# Patient Record
Sex: Male | Born: 1955 | Race: White | Hispanic: No | Smoking: Former smoker
Health system: Southern US, Community
[De-identification: ages and names within clinical notes are randomized; demographics above are authoritative.]

## PROBLEM LIST (undated history)

## (undated) DIAGNOSIS — T7840XA Allergy, unspecified, initial encounter: Secondary | ICD-10-CM

## (undated) DIAGNOSIS — K219 Gastro-esophageal reflux disease without esophagitis: Secondary | ICD-10-CM

## (undated) DIAGNOSIS — F419 Anxiety disorder, unspecified: Secondary | ICD-10-CM

## (undated) DIAGNOSIS — I10 Essential (primary) hypertension: Secondary | ICD-10-CM

## (undated) DIAGNOSIS — R09A2 Foreign body sensation, throat: Secondary | ICD-10-CM

## (undated) DIAGNOSIS — E785 Hyperlipidemia, unspecified: Secondary | ICD-10-CM

## (undated) DIAGNOSIS — R0989 Other specified symptoms and signs involving the circulatory and respiratory systems: Secondary | ICD-10-CM

## (undated) DIAGNOSIS — R198 Other specified symptoms and signs involving the digestive system and abdomen: Secondary | ICD-10-CM

## (undated) HISTORY — DX: Gastro-esophageal reflux disease without esophagitis: K21.9

## (undated) HISTORY — DX: Anxiety disorder, unspecified: F41.9

## (undated) HISTORY — PX: UPPER GASTROINTESTINAL ENDOSCOPY: SHX188

## (undated) HISTORY — PX: COLONOSCOPY: SHX174

## (undated) HISTORY — DX: Foreign body sensation, throat: R09.A2

## (undated) HISTORY — DX: Essential (primary) hypertension: I10

## (undated) HISTORY — DX: Hyperlipidemia, unspecified: E78.5

## (undated) HISTORY — DX: Other specified symptoms and signs involving the digestive system and abdomen: R19.8

## (undated) HISTORY — DX: Allergy, unspecified, initial encounter: T78.40XA

## (undated) HISTORY — DX: Other specified symptoms and signs involving the circulatory and respiratory systems: R09.89

---

## 1990-01-15 HISTORY — PX: INGUINAL HERNIA REPAIR: SUR1180

## 2000-08-30 ENCOUNTER — Emergency Department (HOSPITAL_COMMUNITY): Admission: EM | Admit: 2000-08-30 | Discharge: 2000-08-30 | Payer: Self-pay | Admitting: Emergency Medicine

## 2000-08-30 ENCOUNTER — Encounter: Payer: Self-pay | Admitting: Emergency Medicine

## 2009-08-09 ENCOUNTER — Ambulatory Visit: Payer: Self-pay | Admitting: Cardiovascular Disease

## 2009-08-09 ENCOUNTER — Observation Stay (HOSPITAL_COMMUNITY): Admission: EM | Admit: 2009-08-09 | Discharge: 2009-08-10 | Payer: Self-pay | Admitting: Emergency Medicine

## 2010-04-01 LAB — DIFFERENTIAL
Basophils Absolute: 0 10*3/uL (ref 0.0–0.1)
Basophils Relative: 1 % (ref 0–1)
Eosinophils Absolute: 0.1 10*3/uL (ref 0.0–0.7)
Eosinophils Relative: 1 % (ref 0–5)
Lymphocytes Relative: 23 % (ref 12–46)
Lymphs Abs: 1.5 10*3/uL (ref 0.7–4.0)
Monocytes Absolute: 0.7 10*3/uL (ref 0.1–1.0)
Monocytes Relative: 10 % (ref 3–12)
Neutro Abs: 4.3 10*3/uL (ref 1.7–7.7)
Neutrophils Relative %: 65 % (ref 43–77)

## 2010-04-01 LAB — CBC
HCT: 46.7 % (ref 39.0–52.0)
Hemoglobin: 16 g/dL (ref 13.0–17.0)
MCH: 32.9 pg (ref 26.0–34.0)
MCHC: 34.3 g/dL (ref 30.0–36.0)
MCV: 96 fL (ref 78.0–100.0)
Platelets: 239 10*3/uL (ref 150–400)
RBC: 4.86 MIL/uL (ref 4.22–5.81)
RDW: 12.8 % (ref 11.5–15.5)
WBC: 6.6 10*3/uL (ref 4.0–10.5)

## 2010-04-01 LAB — BASIC METABOLIC PANEL
BUN: 11 mg/dL (ref 6–23)
CO2: 28 mEq/L (ref 19–32)
Calcium: 9 mg/dL (ref 8.4–10.5)
Chloride: 104 mEq/L (ref 96–112)
Creatinine, Ser: 0.99 mg/dL (ref 0.4–1.5)
GFR calc Af Amer: >60 mL/min (ref >60)
GFR calc non Af Amer: >60 mL/min (ref >60)
GFR calc non Af Amer: >60 mL/min (ref >60)
Glucose, Bld: 103 mg/dL — ABNORMAL HIGH (ref 70–99)
Potassium: 3.5 mEq/L (ref 3.5–5.1)
Potassium: 3.5 mEq/L (ref 3.5–5.1)
Sodium: 139 mEq/L (ref 135–145)
Sodium: 139 mEq/L (ref 135–145)

## 2010-04-01 LAB — POCT CARDIAC MARKERS
Myoglobin, poc: 53.8 ng/mL (ref 12–200)
Troponin i, poc: <0.05 ng/mL (ref 0.00–0.09)

## 2010-04-01 LAB — CARDIAC PANEL(CRET KIN+CKTOT+MB+TROPI)
CK, MB: 0.7 ng/mL (ref 0.3–4.0)
Relative Index: INVALID (ref 0.0–2.5)
Relative Index: INVALID (ref 0.0–2.5)
Relative Index: INVALID (ref 0.0–2.5)
Total CK: 65 U/L (ref 7–232)
Total CK: 76 U/L (ref 7–232)
Troponin I: <0.01 ng/mL (ref 0.00–0.06)
Troponin I: <0.01 ng/mL (ref 0.00–0.06)

## 2010-04-01 LAB — HEPATIC FUNCTION PANEL
ALT: 23 U/L (ref 0–53)
AST: 29 U/L (ref 0–37)
Albumin: 4.6 g/dL (ref 3.5–5.2)
Alkaline Phosphatase: 44 U/L (ref 39–117)
Bilirubin, Direct: 0.2 mg/dL (ref 0.0–0.3)
Indirect Bilirubin: 0.4 mg/dL (ref 0.3–0.9)
Total Bilirubin: 0.6 mg/dL (ref 0.3–1.2)
Total Protein: 7.8 g/dL (ref 6.0–8.3)

## 2010-04-01 LAB — LIPID PANEL
Total CHOL/HDL Ratio: 2.8 RATIO
VLDL: 16 mg/dL (ref 0–40)

## 2010-04-01 LAB — PROTIME-INR: Prothrombin Time: 13.6 seconds (ref 11.6–15.2)

## 2010-04-01 LAB — LIPASE, BLOOD: Lipase: 35 U/L (ref 11–59)

## 2011-08-23 IMAGING — CR DG CHEST 1V PORT
1 series · 1 of 1 positions shown · non-contrast
Comparison: None.

CLINICAL DATA: Chest and left arm pain

PORTABLE CHEST - 1 VIEW

[view not recorded]
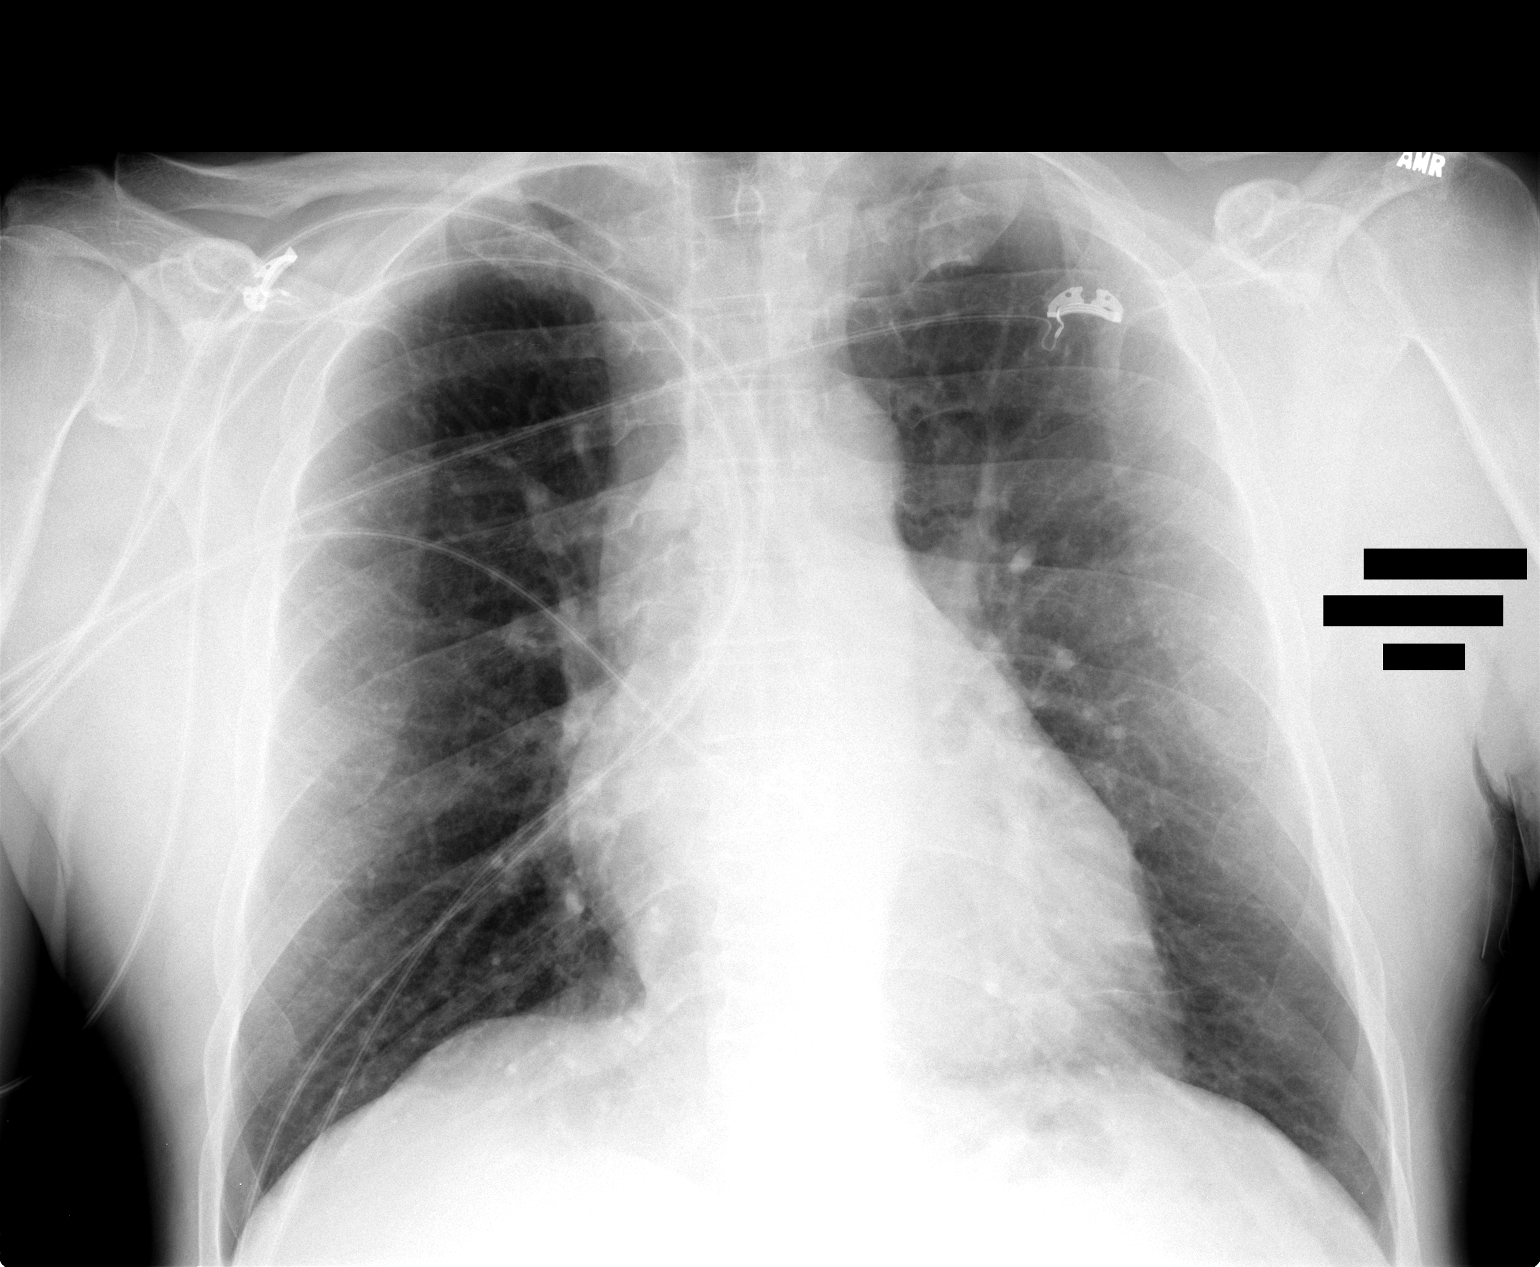

[1 of 1 positions shown; findings below may reference images not displayed]

FINDINGS: The lungs are mildly hyperexpanded and there is
interstitial prominence seen throughout both lungs.  Linear
opacities are seen at the left lung base favoring atelectasis.
The cardiac silhouette is at the upper limits of normal in size.
The upper abdomen and osseous structures unremarkable.  .
IMPRESSION: Mild pulmonary vascular congestion superimposed on chronic changes.

## 2015-10-27 ENCOUNTER — Other Ambulatory Visit: Payer: Self-pay

## 2015-10-27 DIAGNOSIS — Z1211 Encounter for screening for malignant neoplasm of colon: Secondary | ICD-10-CM

## 2015-10-27 DIAGNOSIS — K30 Functional dyspepsia: Secondary | ICD-10-CM

## 2015-10-27 DIAGNOSIS — K219 Gastro-esophageal reflux disease without esophagitis: Secondary | ICD-10-CM

## 2015-10-31 ENCOUNTER — Encounter: Payer: Self-pay | Admitting: Gastroenterology

## 2015-11-08 ENCOUNTER — Encounter: Payer: Self-pay | Admitting: Gastroenterology

## 2015-11-08 ENCOUNTER — Encounter: Payer: Self-pay | Admitting: *Deleted

## 2015-11-14 ENCOUNTER — Encounter: Payer: Self-pay | Admitting: Gastroenterology

## 2015-11-14 ENCOUNTER — Ambulatory Visit (AMBULATORY_SURGERY_CENTER): Payer: BLUE CROSS/BLUE SHIELD | Admitting: Gastroenterology

## 2015-11-14 VITALS — BP 137/92 | HR 68 | Temp 99.1°F | Resp 12 | Ht 72.0 in | Wt 205.0 lb

## 2015-11-14 DIAGNOSIS — Z1211 Encounter for screening for malignant neoplasm of colon: Secondary | ICD-10-CM | POA: Diagnosis not present

## 2015-11-14 DIAGNOSIS — Z1212 Encounter for screening for malignant neoplasm of rectum: Secondary | ICD-10-CM

## 2015-11-14 DIAGNOSIS — K219 Gastro-esophageal reflux disease without esophagitis: Secondary | ICD-10-CM

## 2015-11-14 DIAGNOSIS — D125 Benign neoplasm of sigmoid colon: Secondary | ICD-10-CM | POA: Diagnosis not present

## 2015-11-14 DIAGNOSIS — D120 Benign neoplasm of cecum: Secondary | ICD-10-CM

## 2015-11-14 DIAGNOSIS — R1013 Epigastric pain: Secondary | ICD-10-CM

## 2015-11-14 MED ORDER — PANTOPRAZOLE SODIUM 40 MG PO TBEC: 40.0000 mg | DELAYED_RELEASE_TABLET | Freq: Two times a day (BID) | ORAL | 11 refills | Status: DC

## 2015-11-14 MED ORDER — SODIUM CHLORIDE 0.9 % IV SOLN: 500.0000 mL | INTRAVENOUS | Status: DC

## 2015-11-14 NOTE — Op Note (Signed)
[place] Patient Name: Rodney Park Procedure Date: 11/14/2015 3:19 PM MRN: [id number] Endoscopist: Mauri Pole , MD Age: 60 Referring MD:  Date of Birth: 10-Apr-1955 Gender: Male Account #: [id number] Procedure:                Upper GI endoscopy Indications:              New-onset upper abdominal symptoms in patient older                            than 50 years, Heartburn, Suspected                            gastro-esophageal reflux disease Medicines:                Monitored Anesthesia Care Procedure:                Pre-Anesthesia Assessment:                           - Prior to the procedure, a History and Physical                            was performed, and patient medications and                            allergies were reviewed. The patient's tolerance of                            previous anesthesia was also reviewed. The risks                            and benefits of the procedure and the sedation                            options and risks were discussed with the patient.                            All questions were answered, and informed consent                            was obtained. Prior Anticoagulants: The patient has                            taken no previous anticoagulant or antiplatelet                            agents. ASA Grade Assessment: II - A patient with                            mild systemic disease. After reviewing the risks                            and benefits, the patient was deemed in  satisfactory condition to undergo the procedure.                           After obtaining informed consent, the endoscope was                            passed under direct vision. Throughout the                            procedure, the patient's blood pressure, pulse, and                            oxygen saturations were monitored continuously. The                            Model GIF-HQ190 [id number])  scope was introduced                            through the mouth, and advanced to the second part                            of duodenum. The upper GI endoscopy was                            accomplished without difficulty. The patient                            tolerated the procedure well. Scope In: Scope Out: Findings:                 The esophagus and gastroesophageal junction were                            examined with white light and narrow band imaging                            (NBI). There were esophageal mucosal changes                            suggestive of long-segment Barrett's esophagus,                            classified as Barrett's stage C2-M4 per [place]                            criteria. These changes involved the mucosa at the                            upper extent of the gastric folds (43 cm from the                            incisors) extending to the Z-line (39 cm from the  incisors). Circumferential salmon-colored mucosa                            was present at 41 cm and two tongues of                            salmon-colored mucosa were present at 39 cm. The                            maximum longitudinal extent of these esophageal                            mucosal changes was 4 cm in length. Mucosa was                            biopsied with a cold forceps for histology in 4                            quadrants at intervals of 2 cm at 43 cm from the                            incisors. A total of 2 specimen bottles were sent                            to pathology.                           A few localized, less than 5 mm non-bleeding                            erosions were found in the gastric antrum. There                            were stigmata of recent bleeding. Biopsies were                            taken with a cold forceps for Helicobacter pylori                            testing.                            The examined duodenum was normal. Complications:            No immediate complications. Estimated Blood Loss:     Estimated blood loss was minimal. Impression:               - Esophageal mucosal changes suggestive of                            long-segment Barrett's esophagus, classified as                            Barrett's stage C2-M4 per Prague criteria. Biopsied.                           -  Non-bleeding erosive gastropathy. Biopsied.                           - Normal examined duodenum. Recommendation:           - Patient has a contact number available for                            emergencies. The signs and symptoms of potential                            delayed complications were discussed with the                            patient. Return to normal activities tomorrow.                            Written discharge instructions were provided to the                            patient.                           - Resume previous diet.                           - Continue present medications.                           - Await pathology results.                           - Use Protonix (pantoprazole) 40 mg PO BID.                           - Follow an antireflux regimen. Mauri Pole, MD 11/14/2015 4:01:58 PM This report has been signed electronically.

## 2015-11-14 NOTE — Progress Notes (Signed)
No egg or soy allergy known to patient  No issues with past sedation with any surgeries  or procedures, no intubation problems  No diet pills per patient No home 02 use per patient  No blood thinners per patient  Pt denies issues with constipation  No A fib or A flutter   

## 2015-11-14 NOTE — Patient Instructions (Signed)
Discharge instructions given. Handouts on polyps,diverticulosis. Prescription sent into pharmacy. No ibuprofen,naproxen,or other non-steroidal anti-inflammatory drugs for 2 weeks. Biopsies taken. Resume previous medications. YOU HAD AN ENDOSCOPIC PROCEDURE TODAY AT [facility]:   Refer to the procedure report that was given to you for any specific questions about what was found during the examination.  If the procedure report does not answer your questions, please call your gastroenterologist to clarify.  If you requested that your care partner not be given the details of your procedure findings, then the procedure report has been included in a sealed envelope for you to review at your convenience later.  YOU SHOULD EXPECT: Some feelings of bloating in the abdomen. Passage of more gas than usual.  Walking can help get rid of the air that was put into your GI tract during the procedure and reduce the bloating. If you had a lower endoscopy (such as a colonoscopy or flexible sigmoidoscopy) you may notice spotting of blood in your stool or on the toilet paper. If you underwent a bowel prep for your procedure, you may not have a normal bowel movement for a few days.  Please Note:  You might notice some irritation and congestion in your nose or some drainage.  This is from the oxygen used during your procedure.  There is no need for concern and it should clear up in a day or so.  SYMPTOMS TO REPORT IMMEDIATELY:   Following lower endoscopy (colonoscopy or flexible sigmoidoscopy):  Excessive amounts of blood in the stool  Significant tenderness or worsening of abdominal pains  Swelling of the abdomen that is new, acute  Fever of 100F or higher   Following upper endoscopy (EGD)  Vomiting of blood or coffee ground material  New chest pain or pain under the shoulder blades  Painful or persistently difficult swallowing  New shortness of breath  Fever of 100F or higher  Black,  tarry-looking stools  For urgent or emergent issues, a gastroenterologist can be reached at any hour by calling [phone].   DIET:  We do recommend a small meal at first, but then you may proceed to your regular diet.  Drink plenty of fluids but you should avoid alcoholic beverages for 24 hours.  ACTIVITY:  You should plan to take it easy for the rest of today and you should NOT DRIVE or use heavy machinery until tomorrow (because of the sedation medicines used during the test).    FOLLOW UP: Our staff will call the number listed on your records the next business day following your procedure to check on you and address any questions or concerns that you may have regarding the information given to you following your procedure. If we do not reach you, we will leave a message.  However, if you are feeling well and you are not experiencing any problems, there is no need to return our call.  We will assume that you have returned to your regular daily activities without incident.  If any biopsies were taken you will be contacted by phone or by letter within the next 1-3 weeks.  Please call us at [phone] if you have not heard about the biopsies in 3 weeks.    SIGNATURES/CONFIDENTIALITY: You and/or your care partner have signed paperwork which will be entered into your electronic medical record.  These signatures attest to the fact that that the information above on your After Visit Summary has been reviewed and is understood.  Full responsibility of  the confidentiality of this discharge information lies with you and/or your care-partner. 

## 2015-11-14 NOTE — Progress Notes (Signed)
Called to room to assist during endoscopic procedure.  Patient ID and intended procedure confirmed with present staff. Received instructions for my participation in the procedure from the performing physician.  

## 2015-11-14 NOTE — Progress Notes (Signed)
To Pacu  PT awake and alert. Report to RN

## 2015-11-14 NOTE — Op Note (Signed)
[place] Patient Name: Rodney Park Procedure Date: 11/14/2015 3:18 PM MRN: [id number] Endoscopist: Mauri Pole , MD Age: 60 Referring MD:  Date of Birth: 13-Feb-1955 Gender: Male Account #: [id number] Procedure:                Colonoscopy Indications:              Screening for colorectal malignant neoplasm, This                            is the patient's first colonoscopy Medicines:                Monitored Anesthesia Care Procedure:                Pre-Anesthesia Assessment:                           - Prior to the procedure, a History and Physical                            was performed, and patient medications and                            allergies were reviewed. The patient's tolerance of                            previous anesthesia was also reviewed. The risks                            and benefits of the procedure and the sedation                            options and risks were discussed with the patient.                            All questions were answered, and informed consent                            was obtained. Prior Anticoagulants: The patient has                            taken no previous anticoagulant or antiplatelet                            agents. ASA Grade Assessment: II - A patient with                            mild systemic disease. After reviewing the risks                            and benefits, the patient was deemed in                            satisfactory condition to undergo the procedure.  After obtaining informed consent, the colonoscope                            was passed under direct vision. Throughout the                            procedure, the patient's blood pressure, pulse, and                            oxygen saturations were monitored continuously. The                            Model CF-HQ190L [id number]) scope was introduced                            through the anus and  advanced to the the terminal                            ileum, with identification of the appendiceal                            orifice and IC valve. The colonoscopy was performed                            without difficulty. The patient tolerated the                            procedure well. The quality of the bowel                            preparation was excellent. The terminal ileum,                            ileocecal valve, appendiceal orifice, and rectum                            were photographed. The quality of the bowel                            preparation was evaluated using the BBPS [facility]                            Bowel Preparation Scale) with scores of: Right                            Colon = 3, Transverse Colon = 3 and Left Colon = 3                            (entire mucosa seen well with no residual staining,                            small fragments of stool or opaque liquid). The  total BBPS score equals 9. Scope In: 3:36:05 PM Scope Out: 3:53:32 PM Scope Withdrawal Time: 0 hours 13 minutes 58 seconds  Total Procedure Duration: 0 hours 17 minutes 27 seconds  Findings:                 The perianal and digital rectal examinations were                            normal.                           A 11 mm polyp was found in the cecum. The polyp was                            flat with mucus cap. The polyp was removed with a                            cold snare in piecemeal. Resection and retrieval                            were complete.                           A 5 mm polyp was found in the sigmoid colon. The                            polyp was sessile. The polyp was removed with a                            cold biopsy forceps. Resection and retrieval were                            complete.                           Multiple small and large-mouthed diverticula were                            found in the sigmoid colon and  descending colon.                            There was no evidence of diverticular bleeding.                           The exam was otherwise without abnormality. Complications:            No immediate complications. Estimated Blood Loss:     Estimated blood loss was minimal. Impression:               - One 11 mm polyp in the cecum, removed with a cold                            snare. Resected and retrieved.                           -  One 5 mm polyp in the sigmoid colon, removed with                            a cold biopsy forceps. Resected and retrieved.                           - Mild diverticulosis in the sigmoid colon and in                            the descending colon. There was no evidence of                            diverticular bleeding.                           - The examination was otherwise normal. Recommendation:           - Patient has a contact number available for                            emergencies. The signs and symptoms of potential                            delayed complications were discussed with the                            patient. Return to normal activities tomorrow.                            Written discharge instructions were provided to the                            patient.                           - Resume previous diet.                           - Continue present medications.                           - Await pathology results.                           - No ibuprofen, naproxen, or other non-steroidal                            anti-inflammatory drugs for 2 weeks.                           - Repeat colonoscopy in 1 years for surveillance                            after piecemeal polypectomy. Mauri Pole, MD 11/14/2015 4:07:28 PM This report has been signed electronically.

## 2015-11-15 ENCOUNTER — Telehealth: Payer: Self-pay

## 2015-11-15 ENCOUNTER — Telehealth: Payer: Self-pay | Admitting: *Deleted

## 2015-11-15 NOTE — Telephone Encounter (Signed)
  Follow up Call-  Call back number 11/14/2015  Post procedure Call Back phone  # [phone]  Permission to leave phone message Yes  Some recent data might be hidden   [facility]

## 2015-11-15 NOTE — Telephone Encounter (Signed)
  Follow up Call-  Call back number 11/14/2015  Post procedure Call Back phone  # [phone]  Permission to leave phone message Yes  Some recent data might be hidden    I spoke with the patient for follow up phone call after his procedure. The patient states that he is experiencing sneezing, dripping nose and dry burning nasal passages. I recommended that he use saline nasal spray and OTC medications to treat the symptoms. Patient also states that he has continued with some gas discomfort today. I also suggested using OTC gas relief medications.    Patient questions:  Do you have a fever, pain , or abdominal swelling? No. Pain Score  0 *  Have you tolerated food without any problems? Yes.    Have you been able to return to your normal activities? Yes.    Do you have any questions about your discharge instructions: Diet   No. Medications  No. Follow up visit  No.  Do you have questions or concerns about your Care? No.  Actions: * If pain score is 4 or above: No action needed, pain <4.

## 2015-11-15 NOTE — Telephone Encounter (Signed)
  Follow up Call-  Call back number 11/14/2015  Post procedure Call Back phone  # [phone]  Permission to leave phone message Yes  Some recent data might be hidden    Patient was called for follow up after his procedure on 11/14/2015. No answer at the number given for follow up phone call. A message was left on the answering machine.

## 2015-11-15 NOTE — Telephone Encounter (Signed)
Patient called stating he has some minor abdominal cramping and the pain is a dull achy feeling. Pain level #3  Since last night before bed.  Per Dr Silverio Decamp patient is to take Gas x or Phazyme and take it easy today and contact the office if abdominal pain worsens   Patient verbally understands

## 2015-11-17 ENCOUNTER — Encounter: Payer: Self-pay | Admitting: *Deleted

## 2015-11-17 ENCOUNTER — Telehealth: Payer: Self-pay | Admitting: Gastroenterology

## 2015-11-17 NOTE — Telephone Encounter (Signed)
Spoke with Nira Conn and questions answered re: pathology.  I spoke with Dr. Silverio Decamp to verify that correct pathology is being done.  The bottle is question is bottle number 2   Per Dr. Silverio Decamp, the pathology site is GE junction, at 42 cm.  R/O Barretts esophagus.  I spoke with Tawana and gave her the information per Dr. Silverio Decamp

## 2015-11-20 ENCOUNTER — Encounter: Payer: Self-pay | Admitting: Gastroenterology

## 2016-04-18 ENCOUNTER — Telehealth: Payer: Self-pay

## 2016-04-18 MED ORDER — RANITIDINE HCL 300 MG PO TABS: 300.0000 mg | ORAL_TABLET | Freq: Every day | ORAL | 3 refills | Status: DC

## 2016-04-18 NOTE — Telephone Encounter (Signed)
Pt has been taking protonix twice daily and 2 weeks ago has been having breakthrough reflux and wakes up clearing his throat.  Pt advised to try zantac at bedtime and call back if he does not get relief.  Anti reflux precautions given.  Zantac sent to the pharmacy due to more cost effectiveness.

## 2016-08-28 ENCOUNTER — Other Ambulatory Visit: Payer: Self-pay

## 2016-08-28 DIAGNOSIS — K219 Gastro-esophageal reflux disease without esophagitis: Secondary | ICD-10-CM

## 2016-08-28 MED ORDER — PANTOPRAZOLE SODIUM 40 MG PO TBEC: 40.0000 mg | DELAYED_RELEASE_TABLET | Freq: Two times a day (BID) | ORAL | 3 refills | Status: DC

## 2016-08-28 MED ORDER — RANITIDINE HCL 300 MG PO TABS
300.0000 mg | ORAL_TABLET | Freq: Every day | ORAL | 3 refills | Status: DC
Start: 2016-08-28 — End: 2017-09-17

## 2016-08-30 ENCOUNTER — Telehealth: Payer: Self-pay

## 2016-08-30 NOTE — Telephone Encounter (Signed)
Pts insurance will not pay for Protonix twice a day.  Needs prior authorization. Robin did prior [place].  Approved 08/29/2016 until 08-29-17 Reference number [id number].Patient and pharmacy notified 08-29-16.

## 2016-11-30 ENCOUNTER — Encounter: Payer: Self-pay | Admitting: Gastroenterology

## 2017-02-06 ENCOUNTER — Encounter: Payer: Self-pay | Admitting: Gastroenterology

## 2017-03-19 ENCOUNTER — Other Ambulatory Visit: Payer: Self-pay

## 2017-03-19 DIAGNOSIS — Z86010 Personal history of colonic polyps: Secondary | ICD-10-CM

## 2017-04-11 ENCOUNTER — Encounter: Payer: Self-pay | Admitting: Gastroenterology

## 2017-04-22 ENCOUNTER — Ambulatory Visit (AMBULATORY_SURGERY_CENTER): Payer: BLUE CROSS/BLUE SHIELD | Admitting: Gastroenterology

## 2017-04-22 ENCOUNTER — Encounter: Payer: Self-pay | Admitting: Gastroenterology

## 2017-04-22 ENCOUNTER — Other Ambulatory Visit: Payer: Self-pay

## 2017-04-22 ENCOUNTER — Encounter: Payer: BLUE CROSS/BLUE SHIELD | Admitting: Gastroenterology

## 2017-04-22 VITALS — BP 154/95 | HR 70 | Temp 99.1°F | Resp 15 | Ht 72.0 in | Wt 195.0 lb

## 2017-04-22 DIAGNOSIS — D128 Benign neoplasm of rectum: Secondary | ICD-10-CM

## 2017-04-22 DIAGNOSIS — Z86010 Personal history of colonic polyps: Secondary | ICD-10-CM

## 2017-04-22 DIAGNOSIS — K621 Rectal polyp: Secondary | ICD-10-CM

## 2017-04-22 MED ORDER — SODIUM CHLORIDE 0.9 % IV SOLN: 500.0000 mL | Freq: Once | INTRAVENOUS | Status: DC

## 2017-04-22 NOTE — Patient Instructions (Signed)
INFORMATION ON POLYPS, DIVERTICULOSIS, AND HEMORRHOIDS.  YOU HAD AN ENDOSCOPIC PROCEDURE TODAY AT [facility]:   Refer to the procedure report that was given to you for any specific questions about what was found during the examination.  If the procedure report does not answer your questions, please call your gastroenterologist to clarify.  If you requested that your care partner not be given the details of your procedure findings, then the procedure report has been included in a sealed envelope for you to review at your convenience later.  YOU SHOULD EXPECT: Some feelings of bloating in the abdomen. Passage of more gas than usual.  Walking can help get rid of the air that was put into your GI tract during the procedure and reduce the bloating. If you had a lower endoscopy (such as a colonoscopy or flexible sigmoidoscopy) you may notice spotting of blood in your stool or on the toilet paper. If you underwent a bowel prep for your procedure, you may not have a normal bowel movement for a few days.  Please Note:  You might notice some irritation and congestion in your nose or some drainage.  This is from the oxygen used during your procedure.  There is no need for concern and it should clear up in a day or so.  SYMPTOMS TO REPORT IMMEDIATELY:   Following lower endoscopy (colonoscopy or flexible sigmoidoscopy):  Excessive amounts of blood in the stool  Significant tenderness or worsening of abdominal pains  Swelling of the abdomen that is new, acute  Fever of 100F or higher   For urgent or emergent issues, a gastroenterologist can be reached at any hour by calling [phone].   DIET:  We do recommend a small meal at first, but then you may proceed to your regular diet.  Drink plenty of fluids but you should avoid alcoholic beverages for 24 hours.  ACTIVITY:  You should plan to take it easy for the rest of today and you should NOT DRIVE or use heavy machinery until tomorrow  (because of the sedation medicines used during the test).    FOLLOW UP: Our staff will call the number listed on your records the next business day following your procedure to check on you and address any questions or concerns that you may have regarding the information given to you following your procedure. If we do not reach you, we will leave a message.  However, if you are feeling well and you are not experiencing any problems, there is no need to return our call.  We will assume that you have returned to your regular daily activities without incident.  If any biopsies were taken you will be contacted by phone or by letter within the next 1-3 weeks.  Please call us at [phone] if you have not heard about the biopsies in 3 weeks.    SIGNATURES/CONFIDENTIALITY: You and/or your care partner have signed paperwork which will be entered into your electronic medical record.  These signatures attest to the fact that that the information above on your After Visit Summary has been reviewed and is understood.  Full responsibility of the confidentiality of this discharge information lies with you and/or your care-partner.

## 2017-04-22 NOTE — Progress Notes (Signed)
To recovery, report to RN, VSS. 

## 2017-04-22 NOTE — Progress Notes (Signed)
Called to room to assist during endoscopic procedure.  Patient ID and intended procedure confirmed with present staff. Received instructions for my participation in the procedure from the performing physician.  

## 2017-04-22 NOTE — Op Note (Signed)
[facility] Patient Name: Rodney Park Procedure Date: 04/22/2017 9:26 AM MRN: [id number] Endoscopist: Mauri Pole , MD Age: 62 Referring MD:  Date of Birth: August 03, 1955 Gender: Male Account #: [id number] Procedure:                Colonoscopy Indications:              High risk colon cancer surveillance: Personal                            history of colonic polyps, High risk colon cancer                            surveillance: Personal history of adenoma (10 mm or                            greater in size) Medicines:                Monitored Anesthesia Care Procedure:                Pre-Anesthesia Assessment:                           - Prior to the procedure, a History and Physical                            was performed, and patient medications and                            allergies were reviewed. The patient's tolerance of                            previous anesthesia was also reviewed. The risks                            and benefits of the procedure and the sedation                            options and risks were discussed with the patient.                            All questions were answered, and informed consent                            was obtained. Prior Anticoagulants: The patient has                            taken no previous anticoagulant or antiplatelet                            agents. ASA Grade Assessment: II - A patient with                            mild systemic disease. After reviewing the risks  and benefits, the patient was deemed in                            satisfactory condition to undergo the procedure.                           After obtaining informed consent, the colonoscope                            was passed under direct vision. Throughout the                            procedure, the patient's blood pressure, pulse, and                            oxygen saturations were monitored continuously.  The                            Colonoscope was introduced through the anus and                            advanced to the the cecum, identified by                            appendiceal orifice and ileocecal valve. The                            colonoscopy was performed without difficulty. The                            patient tolerated the procedure well. The quality                            of the bowel preparation was good. The ileocecal                            valve, appendiceal orifice, and rectum were                            photographed. Scope In: 9:37:59 AM Scope Out: 9:52:25 AM Scope Withdrawal Time: 0 hours 12 minutes 41 seconds  Total Procedure Duration: 0 hours 14 minutes 26 seconds  Findings:                 The perianal and digital rectal examinations were                            normal.                           A 2 mm polyp was found in the rectum. The polyp was                            sessile. The polyp was removed with a cold biopsy  forceps. Resection and retrieval were complete.                           Scattered small and large-mouthed diverticula were                            found in the sigmoid colon, descending colon,                            transverse colon and ascending colon.                            Peri-diverticular erythema was seen. There was                            evidence of an impacted diverticulum.                           Non-bleeding internal hemorrhoids were found during                            retroflexion. The hemorrhoids were small.                           The exam was otherwise without abnormality. Complications:            No immediate complications. Estimated Blood Loss:     Estimated blood loss was minimal. Impression:               - One 2 mm polyp in the rectum, removed with a cold                            biopsy forceps. Resected and retrieved.                           -  Moderate diverticulosis in the sigmoid colon, in                            the descending colon, in the transverse colon and                            in the ascending colon. Peri-diverticular erythema                            was seen. There was evidence of an impacted                            diverticulum.                           - Non-bleeding internal hemorrhoids.                           - The examination was otherwise normal. Recommendation:           - Patient has a contact number available for  emergencies. The signs and symptoms of potential                            delayed complications were discussed with the                            patient. Return to normal activities tomorrow.                            Written discharge instructions were provided to the                            patient.                           - Resume previous diet.                           - Continue present medications.                           - Await pathology results.                           - Repeat colonoscopy in 5 years for surveillance                            based on pathology results. Mauri Pole, MD 04/22/2017 9:57:40 AM This report has been signed electronically.

## 2017-04-23 ENCOUNTER — Telehealth: Payer: Self-pay | Admitting: *Deleted

## 2017-04-23 NOTE — Telephone Encounter (Signed)
  Follow up Call-  Call back number 04/22/2017 11/14/2015  Post procedure Call Back phone  # [phone]  Permission to leave phone message Yes Yes  Some recent data might be hidden     Patient questions:  Do you have a fever, pain , or abdominal swelling? No. Pain Score  0 *  Have you tolerated food without any problems? Yes.    Have you been able to return to your normal activities? Yes.    Do you have any questions about your discharge instructions: Diet   No. Medications  No. Follow up visit             no  Do you have questions or concerns about your Care? No.  Actions: * If pain score is 4 or above: No action needed, pain <4.

## 2017-04-23 NOTE — Telephone Encounter (Signed)
  Follow up Call-  Call back number 04/22/2017 11/14/2015  Post procedure Call Back phone  # [phone]  Permission to leave phone message Yes Yes  Some recent data might be hidden     Patient questions:  Message left to call us if necessary.

## 2017-04-25 ENCOUNTER — Encounter: Payer: Self-pay | Admitting: Gastroenterology

## 2017-07-04 ENCOUNTER — Ambulatory Visit: Payer: BLUE CROSS/BLUE SHIELD | Admitting: Gastroenterology

## 2017-09-17 ENCOUNTER — Other Ambulatory Visit: Payer: Self-pay | Admitting: Gastroenterology

## 2017-11-08 ENCOUNTER — Other Ambulatory Visit: Payer: Self-pay | Admitting: Gastroenterology

## 2017-11-08 DIAGNOSIS — K219 Gastro-esophageal reflux disease without esophagitis: Secondary | ICD-10-CM

## 2018-05-12 ENCOUNTER — Telehealth: Payer: Self-pay

## 2018-05-12 NOTE — Telephone Encounter (Signed)
Patients insurance will not cover Pantoprazole 40mg  twice daily. A PA was done and approved for 4/20-2/21. Patient has been notified and will pick up prescription at his pharmacy. Reference# [id number]

## 2018-11-05 ENCOUNTER — Encounter: Payer: Self-pay | Admitting: Gastroenterology

## 2019-05-06 ENCOUNTER — Other Ambulatory Visit: Payer: Self-pay | Admitting: Gastroenterology

## 2019-05-06 DIAGNOSIS — K219 Gastro-esophageal reflux disease without esophagitis: Secondary | ICD-10-CM

## 2019-05-08 ENCOUNTER — Other Ambulatory Visit: Payer: Self-pay | Admitting: Gastroenterology

## 2019-05-08 DIAGNOSIS — K219 Gastro-esophageal reflux disease without esophagitis: Secondary | ICD-10-CM

## 2022-05-16 ENCOUNTER — Encounter: Payer: Self-pay | Admitting: Gastroenterology

## 2022-07-25 NOTE — Progress Notes (Signed)
CLAY SOLUM    [id number]    28-May-1955  Primary Care Physician:Onley, Beverly Gust, MD  Referring Physician: Zachery Dauer, MD [address] [place],  [place]   Chief complaint: No chief complaint on file.   HPI: Rodney Park is a 67 y.o. male presenting to clinic today for consult for esophageal pain. Today,   GI Hx:  Colonoscopy 04-22-17 - One 2 mm polyp in the rectum, removed with a cold biopsy forceps. Resected and retrieved.  - Moderate diverticulosis in the sigmoid colon, in the descending colon, in the transverse colon and in the ascending colon. Peri-diverticular erythema was seen. There was evidence of an impacted diverticulum.  - Non-bleeding internal hemorrhoids.  - The examination was otherwise normal. Surgical [P], rectum, polyp - HYPERPLASTIC POLYP. - NO DYSPLASIA OR MALIGNANCY  Colonoscopy 11-14-15 - One 11 mm polyp in the cecum, removed with a cold snare. Resected and retrieved.  - One 5 mm polyp in the sigmoid colon, removed with a cold biopsy forceps. Resected and retrieved.  - Mild diverticulosis in the sigmoid colon and in the descending colon. There was no evidence of diverticular bleeding.  - The examination was otherwise normal. 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 5. Surgical [P], sigmoid, polyp - TUBULAR ADENOMA (X 1). - NO HIGH GRADE DYSPLASIA OR MALIGNANCY IDENTIFIED  EGD 11-14-15 - Esophageal mucosal changes suggestive of long-segment Barrett's esophagus, classified as Barrett's stage C2-M4 per Prague criteria. Biopsied.  - Non-bleeding erosive gastropathy. Biopsied.  - Normal examined duodenum 1. 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 - WARTHIN-STARRY STAIN IS NEGATIVE FOR HELICOBACTER PYLORI. - NO INTESTINAL METAPLASIA, DYSPLASIA, OR MALIGNANCY. 2. Surgical [P], GE junction - BENIGN GASTRIC MUCOSA  SHOWING MINIMAL CHRONIC INFLAMMATION. - INCIDENTAL BENIGN LYMPHOID AGGREGATE PRESENT. - NO INTESTINAL METAPLASIA, DYSPLASIA, OR MALIGNANCY. - SEE COMMENT. 3. Surgical [P], distal esophagus - FINDINGS CONSISTENT WITH BARRETT'S ESOPHAGUS. - NO DYSPLASIA OR MALIGNANCY IDENTIFIED.   Current Outpatient Medications:    diazepam (VALIUM) 2 MG tablet, Take 2 mg by mouth every 6 (six) hours as needed for anxiety., Disp: , Rfl:    diphenhydrAMINE (BENADRYL) 25 mg capsule, Take 25 mg by mouth at bedtime as needed., Disp: , Rfl:    doxycycline (VIBRAMYCIN) 100 MG capsule, , Disp: , Rfl: 0   methylPREDNISolone (MEDROL DOSEPAK) 4 MG TBPK tablet, , Disp: , Rfl: 0   pantoprazole (PROTONIX) 40 MG tablet, TAKE 1 TABLET BY MOUTH TWICE DAILY, Disp: 60 tablet, Rfl: 17   ranitidine (ZANTAC) 300 MG tablet, TAKE 1 TABLET BY MOUTH AT BEDTIME, Disp: 90 tablet, Rfl: 3   sildenafil (VIAGRA) 100 MG tablet, Take 100 mg by mouth daily as needed for erectile dysfunction., Disp: , Rfl:    traZODone (DESYREL) 100 MG tablet, Take 100 mg by mouth at bedtime., Disp: , Rfl:   Current Facility-Administered Medications:    0.9 %  sodium chloride infusion, 500 mL, Intravenous, Continuous, Clarisse Rodriges V, MD   0.9 %  sodium chloride infusion, 500 mL, Intravenous, Once, Marta Bouie V, MD   Allergies as of 07/26/2022 - Review Complete 04/22/2017  Allergen Reaction Noted   Penicillins Swelling 11/08/2015    Past Medical History:  Diagnosis Date   Allergy    Anxiety    GERD (gastroesophageal reflux disease)    Globus sensation     Past Surgical  History:  Procedure Laterality Date   INGUINAL HERNIA REPAIR  1992   x 2    Family History  Problem Relation Age of Onset   Breast cancer Mother    Heart disease Father    Colon cancer Neg Hx    Colon polyps Neg Hx    Rectal cancer Neg Hx    Stomach cancer Neg Hx    Esophageal cancer Neg Hx     Social History   Socioeconomic History   Marital status:  Divorced    Spouse name: Not on file   Number of children: Not on file   Years of education: Not on file   Highest education level: Not on file  Occupational History   Not on file  Tobacco Use   Smoking status: Former    Types: Cigars    Quit date: 06/2016    Years since quitting: 6.1   Smokeless tobacco: Never   Tobacco comments:    socially  Substance and Sexual Activity   Alcohol use: Yes    Comment: socially   Drug use: No   Sexual activity: Yes    Partners: Female  Other Topics Concern   Not on file  Social History Narrative   Not on file   Social Determinants of Health   Financial Resource Strain: Not on file  Food Insecurity: Not on file  Transportation Needs: Not on file  Physical Activity: Not on file  Stress: Not on file  Social Connections: Not on file  Intimate Partner Violence: Not on file      Review of systems: Review of Systems    Physical Exam: There were no vitals filed for this visit. There is no height or weight on file to calculate BMI.  General: well-appearing ***  Eyes: sclera anicteric, no redness ENT: oral mucosa moist without lesions, no cervical or supraclavicular lymphadenopathy CV: RRR, no JVD, no peripheral edema Resp: clear to auscultation bilaterally, normal RR and effort noted GI: soft, no tenderness, with active bowel sounds. No guarding or palpable organomegaly noted. Skin; warm and dry, no rash or jaundice noted Neuro: awake, alert and oriented x 3. Normal gross motor function and fluent speech   Data Reviewed:  Reviewed labs, radiology imaging, old records and pertinent past GI work up   Assessment and Plan/Recommendations:  ***  This visit required *** minutes of patient care (this includes precharting, chart review, review of results, face-to-face time used for counseling as well as treatment plan and follow-up. The patient was provided an opportunity to ask questions and all were answered. The patient agreed with  the plan and demonstrated an understanding of the instructions.  Iona Beard , MD  CC: Zachery Dauer, MD   Ladona Mow Hewitt Shorts as a scribe for Marsa Aris, MD.,have documented all relevant documentation on the behalf of Marsa Aris, MD,as directed by  Marsa Aris, MD while in the presence of Marsa Aris, MD.   I, Marsa Aris, MD, have reviewed all documentation for this visit. The documentation on 07/25/22 for the exam, diagnosis, procedures, and orders are all accurate and complete.

## 2022-07-26 ENCOUNTER — Encounter: Payer: Self-pay | Admitting: Gastroenterology

## 2022-07-26 ENCOUNTER — Ambulatory Visit: Payer: Medicare PPO | Admitting: Gastroenterology

## 2022-07-26 DIAGNOSIS — K219 Gastro-esophageal reflux disease without esophagitis: Secondary | ICD-10-CM | POA: Diagnosis not present

## 2022-07-26 DIAGNOSIS — Z8719 Personal history of other diseases of the digestive system: Secondary | ICD-10-CM

## 2022-07-26 DIAGNOSIS — R070 Pain in throat: Secondary | ICD-10-CM | POA: Diagnosis not present

## 2022-07-26 DIAGNOSIS — Z86010 Personal history of colonic polyps: Secondary | ICD-10-CM

## 2022-07-26 MED ORDER — FAMOTIDINE 20 MG PO TABS: 20.0000 mg | ORAL_TABLET | Freq: Every day | ORAL | 3 refills | Status: DC

## 2022-07-26 MED ORDER — PANTOPRAZOLE SODIUM 40 MG PO TBEC: 40.0000 mg | DELAYED_RELEASE_TABLET | Freq: Two times a day (BID) | ORAL | 3 refills | Status: DC

## 2022-07-26 MED ORDER — NA SULFATE-K SULFATE-MG SULF 17.5-3.13-1.6 GM/177ML PO SOLN: 1.0000 | ORAL | 0 refills | Status: DC

## 2022-07-26 MED ORDER — FAMOTIDINE 20 MG PO TABS: 20.0000 mg | ORAL_TABLET | Freq: Every day | ORAL | 3 refills | Status: AC

## 2022-07-26 MED ORDER — PANTOPRAZOLE SODIUM 40 MG PO TBEC
40.0000 mg | DELAYED_RELEASE_TABLET | Freq: Two times a day (BID) | ORAL | 3 refills | Status: AC
Start: 2022-07-26 — End: ?

## 2022-07-26 NOTE — Patient Instructions (Signed)
_______________________________________________________  If your blood pressure at your visit was 140/90 or greater, please contact your primary care physician to follow up on this. _______________________________________________________  If you are age 66 or older, your body mass index should be between 23-30. Your Body mass index is 30.38 kg/m. If this is out of the aforementioned range listed, please consider follow up with your Primary Care Provider. ________________________________________________________  The [facility] providers would like to encourage you to use [facility] to communicate with providers for non-urgent requests or questions.  Due to long hold times on the telephone, sending your provider a message by [facility] may be a faster and more efficient way to get a response.  Please allow 48 business hours for a response.  Please remember that this is for non-urgent requests.  _______________________________________________________  We have sent the following medications to your pharmacy for you to pick up at your convenience:  INCREASE: pantoprazole 40mg  to one tablet twice daily.  Take 30 minutes prior to breakfast and dinner meals.  START: Pepcid 20mg  one tablet at bedtime each night  You have been scheduled for an endoscopy and colonoscopy. Please follow the written instructions given to you at your visit today.  Please pick up your prep supplies at the pharmacy within the next 1-3 days.  If you use inhalers (even only as needed), please bring them with you on the day of your procedure.  DO NOT TAKE 7 DAYS PRIOR TO TEST- Trulicity (dulaglutide) Ozempic, Wegovy (semaglutide) Mounjaro (tirzepatide) Bydureon Bcise (exanatide extended release)  DO NOT TAKE 1 DAY PRIOR TO YOUR TEST Rybelsus (semaglutide) Adlyxin (lixisenatide) Victoza (liraglutide) Byetta (exanatide) ___________________________________________________________________________  Due to recent changes in  healthcare laws, you may see the results of your imaging and laboratory studies on MyChart before your provider has had a chance to review them.  We understand that in some cases there may be results that are confusing or concerning to you. Not all laboratory results come back in the same time frame and the provider may be waiting for multiple results in order to interpret others.  Please give [place] 48 hours in order for your provider to thoroughly review all the results before contacting the office for clarification of your results.   Gastroesophageal Reflux Disease, Adult Gastroesophageal reflux (GER) happens when acid from the stomach flows up into the tube that connects the mouth and the stomach (esophagus). Normally, food travels down the esophagus and stays in the stomach to be digested. However, when a person has GER, food and stomach acid sometimes move back up into the esophagus. If this becomes a more serious problem, the person may be diagnosed with a disease called gastroesophageal reflux disease (GERD). GERD occurs when the reflux: Happens often. Causes frequent or severe symptoms. Causes problems such as damage to the esophagus. When stomach acid comes in contact with the esophagus, the acid may cause inflammation in the esophagus. Over time, GERD may create small holes (ulcers) in the lining of the esophagus. What are the causes? This condition is caused by a problem with the muscle between the esophagus and the stomach (lower esophageal sphincter, or LES). Normally, the LES muscle closes after food passes through the esophagus to the stomach. When the LES is weakened or abnormal, it does not close properly, and that allows food and stomach acid to go back up into the esophagus. The LES can be weakened by certain dietary substances, medicines, and medical conditions, including: Tobacco use. Pregnancy. Having a hiatal hernia. Alcohol use.  Certain foods and beverages, such as coffee,  chocolate, onions, and peppermint. What increases the risk? You are more likely to develop this condition if you: Have an increased body weight. Have a connective tissue disorder. Take NSAIDs, such as ibuprofen. What are the signs or symptoms? Symptoms of this condition include: Heartburn. Difficult or painful swallowing and the feeling of having a lump in the throat. A bitter taste in the mouth. Bad breath and having a large amount of saliva. Having an upset or bloated stomach and belching. Chest pain. Different conditions can cause chest pain. Make sure you see your health care provider if you experience chest pain. Shortness of breath or wheezing. Ongoing (chronic) cough or a nighttime cough. Wearing away of tooth enamel. Weight loss. How is this diagnosed? This condition may be diagnosed based on a medical history and a physical exam. To determine if you have mild or severe GERD, your health care provider may also monitor how you respond to treatment. You may also have tests, including: A test to examine your stomach and esophagus with a small camera (endoscopy). A test that measures the acidity level in your esophagus. A test that measures how much pressure is on your esophagus. A barium swallow or modified barium swallow test to show the shape, size, and functioning of your esophagus. How is this treated? Treatment for this condition may vary depending on how severe your symptoms are. Your health care provider may recommend: Changes to your diet. Medicine. Surgery. The goal of treatment is to help relieve your symptoms and to prevent complications. Follow these instructions at home: Eating and drinking  Follow a diet as recommended by your health care provider. This may involve avoiding foods and drinks such as: Coffee and tea, with or without caffeine. Drinks that contain alcohol. Energy drinks and sports drinks. Carbonated drinks or sodas. Chocolate and  cocoa. Peppermint and mint flavorings. Garlic and onions. Horseradish. Spicy and acidic foods, including peppers, chili powder, curry powder, vinegar, hot sauces, and barbecue sauce. Citrus fruit juices and citrus fruits, such as oranges, lemons, and limes. Tomato-based foods, such as red sauce, chili, salsa, and pizza with red sauce. Fried and fatty foods, such as donuts, french fries, potato chips, and high-fat dressings. High-fat meats, such as hot dogs and fatty cuts of red and white meats, such as rib eye steak, sausage, ham, and bacon. High-fat dairy items, such as whole milk, butter, and cream cheese. Eat small, frequent meals instead of large meals. Avoid drinking large amounts of liquid with your meals. Avoid eating meals during the 2-3 hours before bedtime. Avoid lying down right after you eat. Do not exercise right after you eat. Lifestyle  Do not use any products that contain nicotine or tobacco. These products include cigarettes, chewing tobacco, and vaping devices, such as e-cigarettes. If you need help quitting, ask your health care provider. Try to reduce your stress by using methods such as yoga or meditation. If you need help reducing stress, ask your health care provider. If you are overweight, reduce your weight to an amount that is healthy for you. Ask your health care provider for guidance about a safe weight loss goal. General instructions Pay attention to any changes in your symptoms. Take over-the-counter and prescription medicines only as told by your health care provider. Do not take aspirin, ibuprofen, or other NSAIDs unless your health care provider told you to take these medicines. Wear loose-fitting clothing. Do not wear anything tight around your waist that causes  pressure on your abdomen. Raise (elevate) the head of your bed about 6 inches (15 cm). You can use a wedge to do this. Avoid bending over if this makes your symptoms worse. Keep all follow-up  visits. This is important. Contact a health care provider if: You have: New symptoms. Unexplained weight loss. Difficulty swallowing or it hurts to swallow. Wheezing or a persistent cough. A hoarse voice. Your symptoms do not improve with treatment. Get help right away if: You have sudden pain in your arms, neck, jaw, teeth, or back. You suddenly feel sweaty, dizzy, or light-headed. You have chest pain or shortness of breath. You vomit and the vomit is green, yellow, or black, or it looks like blood or coffee grounds. You faint. You have stool that is red, bloody, or black. You cannot swallow, drink, or eat. These symptoms may represent a serious problem that is an emergency. Do not wait to see if the symptoms will go away. Get medical help right away. Call your local emergency services (911 in the [place] Do not drive yourself to the hospital. Summary Gastroesophageal reflux happens when acid from the stomach flows up into the esophagus. GERD is a disease in which the reflux happens often, causes frequent or severe symptoms, or causes problems such as damage to the esophagus. Treatment for this condition may vary depending on how severe your symptoms are. Your health care provider may recommend diet and lifestyle changes, medicine, or surgery. Contact a health care provider if you have new or worsening symptoms. Take over-the-counter and prescription medicines only as told by your health care provider. Do not take aspirin, ibuprofen, or other NSAIDs unless your health care provider told you to do so. Keep all follow-up visits as told by your health care provider. This is important. This information is not intended to replace advice given to you by your health care provider. Make sure you discuss any questions you have with your health care provider. Document Revised: 07/13/2019 Document Reviewed: 07/13/2019 Elsevier Patient Education  2024 [employer]   Thank you for entrusting me  with your care and choosing [facility].  Dr Lavon Paganini

## 2022-09-24 ENCOUNTER — Encounter: Payer: Self-pay | Admitting: Gastroenterology

## 2022-09-26 ENCOUNTER — Telehealth: Payer: Self-pay | Admitting: Gastroenterology

## 2022-09-26 MED ORDER — NA SULFATE-K SULFATE-MG SULF 17.5-3.13-1.6 GM/177ML PO SOLN: 1.0000 | ORAL | 0 refills | Status: DC

## 2022-09-26 NOTE — Telephone Encounter (Signed)
Inbound call from patient requesting for prep medication for 9/23 procedure to be sent to [facility] on [address] in [place]. States prep was originally sent through mail order pharmacy and he is worried he will not received prescription in time. Please advise, thank you.

## 2022-09-26 NOTE — Telephone Encounter (Signed)
Suprep sent to [facility] in [place], [place] per patient's requested.  Patient verbalized understanding.

## 2022-10-08 ENCOUNTER — Ambulatory Visit: Payer: Medicare PPO | Admitting: Gastroenterology

## 2022-10-08 ENCOUNTER — Encounter: Payer: Self-pay | Admitting: Gastroenterology

## 2022-10-08 VITALS — BP 138/90 | HR 68 | Temp 98.2°F | Resp 15 | Ht 72.0 in | Wt 224.0 lb

## 2022-10-08 DIAGNOSIS — D123 Benign neoplasm of transverse colon: Secondary | ICD-10-CM

## 2022-10-08 DIAGNOSIS — K449 Diaphragmatic hernia without obstruction or gangrene: Secondary | ICD-10-CM | POA: Diagnosis not present

## 2022-10-08 DIAGNOSIS — K2270 Barrett's esophagus without dysplasia: Secondary | ICD-10-CM | POA: Diagnosis not present

## 2022-10-08 DIAGNOSIS — K222 Esophageal obstruction: Secondary | ICD-10-CM

## 2022-10-08 DIAGNOSIS — Z09 Encounter for follow-up examination after completed treatment for conditions other than malignant neoplasm: Secondary | ICD-10-CM

## 2022-10-08 DIAGNOSIS — Z86010 Personal history of colonic polyps: Secondary | ICD-10-CM | POA: Diagnosis not present

## 2022-10-08 DIAGNOSIS — Z8719 Personal history of other diseases of the digestive system: Secondary | ICD-10-CM | POA: Diagnosis not present

## 2022-10-08 MED ORDER — SODIUM CHLORIDE 0.9 % IV SOLN: 500.0000 mL | Freq: Once | INTRAVENOUS | Status: DC

## 2022-10-08 NOTE — Patient Instructions (Signed)
Handouts provided:  Polyps and Diverticulosis  YOU HAD AN ENDOSCOPIC PROCEDURE TODAY AT THE [facility]:   Refer to the procedure report that was given to you for any specific questions about what was found during the examination.  If the procedure report does not answer your questions, please call your gastroenterologist to clarify.  If you requested that your care partner not be given the details of your procedure findings, then the procedure report has been included in a sealed envelope for you to review at your convenience later.  YOU SHOULD EXPECT: Some feelings of bloating in the abdomen. Passage of more gas than usual.  Walking can help get rid of the air that was put into your GI tract during the procedure and reduce the bloating. If you had a lower endoscopy (such as a colonoscopy or flexible sigmoidoscopy) you may notice spotting of blood in your stool or on the toilet paper. If you underwent a bowel prep for your procedure, you may not have a normal bowel movement for a few days.  Please Note:  You might notice some irritation and congestion in your nose or some drainage.  This is from the oxygen used during your procedure.  There is no need for concern and it should clear up in a day or so.  SYMPTOMS TO REPORT IMMEDIATELY:  Following lower endoscopy (colonoscopy or flexible sigmoidoscopy):  Excessive amounts of blood in the stool  Significant tenderness or worsening of abdominal pains  Swelling of the abdomen that is new, acute  Fever of 100F or higher  Following upper endoscopy (EGD)  Vomiting of blood or coffee ground material  New chest pain or pain under the shoulder blades  Painful or persistently difficult swallowing  New shortness of breath  Fever of 100F or higher  Black, tarry-looking stools  For urgent or emergent issues, a gastroenterologist can be reached at any hour by calling [phone]. Do not use MyChart messaging for urgent concerns.     DIET:  We do recommend a small meal at first, but then you may proceed to your regular diet.  Drink plenty of fluids but you should avoid alcoholic beverages for 24 hours.  ACTIVITY:  You should plan to take it easy for the rest of today and you should NOT DRIVE or use heavy machinery until tomorrow (because of the sedation medicines used during the test).    FOLLOW UP: Our staff will call the number listed on your records the next business day following your procedure.  We will call around 7:15- 8:00 am to check on you and address any questions or concerns that you may have regarding the information given to you following your procedure. If we do not reach you, we will leave a message.     If any biopsies were taken you will be contacted by phone or by letter within the next 1-3 weeks.  Please call us at [phone] if you have not heard about the biopsies in 3 weeks.    SIGNATURES/CONFIDENTIALITY: You and/or your care partner have signed paperwork which will be entered into your electronic medical record.  These signatures attest to the fact that that the information above on your After Visit Summary has been reviewed and is understood.  Full responsibility of the confidentiality of this discharge information lies with you and/or your care-partner.

## 2022-10-08 NOTE — Progress Notes (Signed)
[facility] History and Physical   Primary Care Physician:  Zachery Dauer, MD   Reason for Procedure:  H/o Barrett's esophagus and h/o colon polyps  Plan:    EGD and colonoscopy with possible interventions as needed     HPI: Rodney Park is a very pleasant 67 y.o. male here for EGD and colonoscopy for surveillance of Barrett's esophagus and h/o colon polyps.   The risks and benefits as well as alternatives of endoscopic procedure(s) have been discussed and reviewed. All questions answered. The patient agrees to proceed.    Past Medical History:  Diagnosis Date   Allergy    Anxiety    GERD (gastroesophageal reflux disease)    Globus sensation    Hyperlipidemia    Hypertension     Past Surgical History:  Procedure Laterality Date   COLONOSCOPY     INGUINAL HERNIA REPAIR  1992   x 2   UPPER GASTROINTESTINAL ENDOSCOPY      Prior to Admission medications   Medication Sig Start Date End Date Taking? Authorizing Provider  atorvastatin (LIPITOR) 10 MG tablet Take 10 mg by mouth daily.   Yes [provider]  diazepam (VALIUM) 2 MG tablet Take 2 mg by mouth every 6 (six) hours as needed for anxiety.   Yes [provider]  doxycycline (VIBRAMYCIN) 100 MG capsule  04/20/17  Yes [provider]  famotidine (PEPCID) 20 MG tablet Take 1 tablet (20 mg total) by mouth at bedtime. 07/26/22  Yes Lawsen Arnott, Eleonore Chiquito, MD  lisinopril (ZESTRIL) 20 MG tablet Take 20 mg by mouth daily.   Yes [provider]  pantoprazole (PROTONIX) 40 MG tablet Take 1 tablet (40 mg total) by mouth 2 (two) times daily. 07/26/22  Yes Lisette Mancebo, Eleonore Chiquito, MD  sertraline (ZOLOFT) 50 MG tablet Take 50 mg by mouth daily.   Yes [provider]  sildenafil (VIAGRA) 100 MG tablet Take 100 mg by mouth daily as needed for erectile dysfunction.   Yes [provider]  azelastine (ASTELIN) 0.1 % nasal spray Place into both nostrils 2 (two) times daily. Use in  each nostril as directed    [provider]    Current Outpatient Medications  Medication Sig Dispense Refill   atorvastatin (LIPITOR) 10 MG tablet Take 10 mg by mouth daily.     diazepam (VALIUM) 2 MG tablet Take 2 mg by mouth every 6 (six) hours as needed for anxiety.     doxycycline (VIBRAMYCIN) 100 MG capsule   0   famotidine (PEPCID) 20 MG tablet Take 1 tablet (20 mg total) by mouth at bedtime. 90 tablet 3   lisinopril (ZESTRIL) 20 MG tablet Take 20 mg by mouth daily.     pantoprazole (PROTONIX) 40 MG tablet Take 1 tablet (40 mg total) by mouth 2 (two) times daily. 180 tablet 3   sertraline (ZOLOFT) 50 MG tablet Take 50 mg by mouth daily.     sildenafil (VIAGRA) 100 MG tablet Take 100 mg by mouth daily as needed for erectile dysfunction.     azelastine (ASTELIN) 0.1 % nasal spray Place into both nostrils 2 (two) times daily. Use in each nostril as directed     Current Facility-Administered Medications  Medication Dose Route Frequency Provider Last Rate Last Admin   0.9 %  sodium chloride infusion  500 mL Intravenous Once Napoleon Form, MD        Allergies as of 10/08/2022 - Review Complete 10/08/2022  Allergen Reaction Noted  Fentanyl Other (See Comments) 10/08/2022   Penicillins Swelling 11/08/2015    Family History  Problem Relation Age of Onset   Breast cancer Mother    Heart disease Father    Colon cancer Neg Hx    Colon polyps Neg Hx    Rectal cancer Neg Hx    Stomach cancer Neg Hx    Esophageal cancer Neg Hx     Social History   Socioeconomic History   Marital status: Divorced    Spouse name: Not on file   Number of children: Not on file   Years of education: Not on file   Highest education level: Not on file  Occupational History   Not on file  Tobacco Use   Smoking status: Former    Types: Cigars    Quit date: 06/2016    Years since quitting: 6.3   Smokeless tobacco: Never   Tobacco comments:    socially  Vaping Use   Vaping status:  Never Used  Substance and Sexual Activity   Alcohol use: Yes    Comment: socially   Drug use: No   Sexual activity: Yes    Partners: Female  Other Topics Concern   Not on file  Social History Narrative   Not on file   Social Determinants of Health   Financial Resource Strain: Not on file  Food Insecurity: Not on file  Transportation Needs: Not on file  Physical Activity: Not on file  Stress: Not on file  Social Connections: Not on file  Intimate Partner Violence: Not on file    Review of Systems:  All other review of systems negative except as mentioned in the HPI.  Physical Exam: Vital signs in last 24 hours: BP (!) 137/90   Pulse 77   Temp 98.2 F (36.8 C)   Ht 6' (1.829 m)   Wt 224 lb (101.6 kg)   SpO2 97%   BMI 30.38 kg/m  Park:   Alert, NAD Lungs:  Clear .   Heart:  Regular rate and rhythm Abdomen:  Soft, nontender and nondistended. Neuro/Psych:  Alert and cooperative. Normal mood and affect. A and O x 3  Reviewed labs, radiology imaging, old records and pertinent past GI work up  Patient is appropriate for planned procedure(s) and anesthesia in an ambulatory setting   K. Scherry Ran , MD [phone]

## 2022-10-08 NOTE — Op Note (Signed)
[facility] Patient Name: Rodney Park Procedure Date: 10/08/2022 10:17 AM MRN: [id number] Endoscopist: Napoleon Form , MD, [id number] Age: 67 Referring MD:  Date of Birth: [birth year] Gender: Male Account #: [id number] Procedure:                Colonoscopy Indications:              High risk colon cancer surveillance: Personal                            history of colonic polyps Medicines:                Monitored Anesthesia Care Procedure:                Pre-Anesthesia Assessment:                           - Prior to the procedure, a History and Physical                            was performed, and patient medications and                            allergies were reviewed. The patient's tolerance of                            previous anesthesia was also reviewed. The risks                            and benefits of the procedure and the sedation                            options and risks were discussed with the patient.                            All questions were answered, and informed consent                            was obtained. Prior Anticoagulants: The patient has                            taken no anticoagulant or antiplatelet agents. ASA                            Grade Assessment: II - A patient with mild systemic                            disease. After reviewing the risks and benefits,                            the patient was deemed in satisfactory condition to                            undergo the procedure.  After obtaining informed consent, the colonoscope                            was passed under direct vision. Throughout the                            procedure, the patient's blood pressure, pulse, and                            oxygen saturations were monitored continuously. The                            Olympus [id number]-[id number] (#[id number]) Colonoscope was                            introduced through the anus and  advanced to the the                            cecum, identified by appendiceal orifice and                            ileocecal valve. The colonoscopy was performed                            without difficulty. The patient tolerated the                            procedure well. The quality of the bowel                            preparation was good. The ileocecal valve,                            appendiceal orifice, and rectum were photographed. Scope In: 10:34:13 AM Scope Out: 10:48:10 AM Scope Withdrawal Time: 0 hours 11 minutes 41 seconds  Total Procedure Duration: 0 hours 13 minutes 57 seconds  Findings:                 The perianal and digital rectal examinations were                            normal.                           Two sessile polyps were found in the transverse                            colon. The polyps were 4 to 6 mm in size. These                            polyps were removed with a cold snare. Resection                            and retrieval were complete.  Scattered small-mouthed diverticula were found in                            the sigmoid colon, descending colon and ascending                            colon. There was narrowing of the colon in                            association with the diverticular opening. There                            was evidence of diverticular spasm.                           Non-bleeding external and internal hemorrhoids were                            found during retroflexion. The hemorrhoids were                            medium-sized. Complications:            No immediate complications. Estimated Blood Loss:     Estimated blood loss was minimal. Impression:               - Two 4 to 6 mm polyps in the transverse colon,                            removed with a cold snare. Resected and retrieved.                           - Moderate diverticulosis in the sigmoid colon, in                             the descending colon and in the ascending colon.                            There was narrowing of the colon in association                            with the diverticular opening. There was evidence                            of diverticular spasm.                           - Non-bleeding external and internal hemorrhoids. Recommendation:           - Patient has a contact number available for                            emergencies. The signs and symptoms of potential  delayed complications were discussed with the                            patient. Return to normal activities tomorrow.                            Written discharge instructions were provided to the                            patient.                           - Resume previous diet.                           - Continue present medications.                           - Await pathology results.                           - Repeat colonoscopy in 5-10 years for surveillance                            based on pathology results. Napoleon Form, MD 10/08/2022 10:55:54 AM This report has been signed electronically.

## 2022-10-08 NOTE — Op Note (Signed)
[facility] Patient Name: Rodney Park Procedure Date: 10/08/2022 10:20 AM MRN: [id number] Endoscopist: Napoleon Form , MD, [id number] Age: 67 Referring MD:  Date of Birth: 1955-04-28 Gender: Male Account #: [id number] Procedure:                Upper GI endoscopy Indications:              Follow-up of Barrett's esophagus Medicines:                Monitored Anesthesia Care Procedure:                Pre-Anesthesia Assessment:                           - Prior to the procedure, a History and Physical                            was performed, and patient medications and                            allergies were reviewed. The patient's tolerance of                            previous anesthesia was also reviewed. The risks                            and benefits of the procedure and the sedation                            options and risks were discussed with the patient.                            All questions were answered, and informed consent                            was obtained. Prior Anticoagulants: The patient has                            taken no anticoagulant or antiplatelet agents. ASA                            Grade Assessment: II - A patient with mild systemic                            disease. After reviewing the risks and benefits,                            the patient was deemed in satisfactory condition to                            undergo the procedure.                           After obtaining informed consent, the endoscope was  passed under direct vision. Throughout the                            procedure, the patient's blood pressure, pulse, and                            oxygen saturations were monitored continuously. The                            Olympus Scope [id number] was introduced through the                            mouth, and advanced to the second part of duodenum.                            The upper GI  endoscopy was accomplished without                            difficulty. The patient tolerated the procedure                            well. Scope In: Scope Out: Findings:                 One benign-appearing, intrinsic mild stenosis was                            found 42 to 43 cm from the incisors. This stenosis                            measured 1.7 cm (inner diameter) x less than one cm                            (in length). The stenosis was traversed. The scope                            was withdrawn. Dilation was performed with a                            Maloney dilator with no resistance at 54 Fr. The                            dilation site was examined following endoscope                            reinsertion and showed no change.                           There were esophageal mucosal changes secondary to                            established short-segment Barrett's disease present  in the lower third of the esophagus. The maximum                            longitudinal extent of these mucosal changes was 2                            cm in length. Mucosa was biopsied with a cold                            forceps for histology in a targeted manner at                            intervals of 1 cm from 42 to 44 cm from the                            incisors. One specimen bottle was sent to pathology.                           A 3 cm hiatal hernia was present.                           The cardia and gastric fundus were normal on                            retroflexion.                           The examined duodenum was normal. Complications:            No immediate complications. Estimated Blood Loss:     Estimated blood loss was minimal. Impression:               - Benign-appearing esophageal stenosis. Dilated.                           - Esophageal mucosal changes secondary to                            established short-segment Barrett's  disease.                            Biopsied.                           - 3 cm hiatal hernia.                           - Normal examined duodenum. Recommendation:           - Patient has a contact number available for                            emergencies. The signs and symptoms of potential                            delayed complications were discussed with the  patient. Return to normal activities tomorrow.                            Written discharge instructions were provided to the                            patient.                           - Resume previous diet.                           - Continue present medications.                           - Await pathology results.                           - Repeat upper endoscopy after studies are complete                            for surveillance of Barrett's esophagus. Napoleon Form, MD 10/08/2022 10:58:41 AM This report has been signed electronically.

## 2022-10-08 NOTE — Progress Notes (Signed)
Vss nad trans to pacu 

## 2022-10-09 ENCOUNTER — Telehealth: Payer: Self-pay

## 2022-10-09 NOTE — Telephone Encounter (Signed)
Follow up call to pt; unable to leave vm

## 2022-10-10 LAB — SURGICAL PATHOLOGY

## 2022-10-12 ENCOUNTER — Encounter: Payer: Self-pay | Admitting: Gastroenterology
